# Patient Record
Sex: Male | Born: 1975 | Race: Black or African American | Hispanic: No | Marital: Single | Smoking: Current every day smoker
Health system: Southern US, Community
[De-identification: ages and names within clinical notes are randomized; demographics above are authoritative.]

## PROBLEM LIST (undated history)

## (undated) DIAGNOSIS — I10 Essential (primary) hypertension: Secondary | ICD-10-CM

## (undated) DIAGNOSIS — E119 Type 2 diabetes mellitus without complications: Secondary | ICD-10-CM

---

## 2015-02-26 ENCOUNTER — Emergency Department
Admit: 2015-02-26 | Disposition: A | Discharge status: Home / Self Care | Payer: Self-pay | Admitting: Emergency Medicine

## 2016-03-20 ENCOUNTER — Encounter: Payer: Self-pay | Admitting: Emergency Medicine

## 2016-03-20 ENCOUNTER — Emergency Department
Admission: EM | Admit: 2016-03-20 | Discharge: 2016-03-20 | Disposition: A | Discharge status: Home / Self Care | Payer: Self-pay | Attending: Emergency Medicine | Admitting: Emergency Medicine

## 2016-03-20 DIAGNOSIS — L240 Irritant contact dermatitis due to detergents: Secondary | ICD-10-CM | POA: Insufficient documentation

## 2016-03-20 DIAGNOSIS — E119 Type 2 diabetes mellitus without complications: Secondary | ICD-10-CM | POA: Insufficient documentation

## 2016-03-20 DIAGNOSIS — L258 Unspecified contact dermatitis due to other agents: Secondary | ICD-10-CM

## 2016-03-20 DIAGNOSIS — F17210 Nicotine dependence, cigarettes, uncomplicated: Secondary | ICD-10-CM | POA: Insufficient documentation

## 2016-03-20 HISTORY — DX: Type 2 diabetes mellitus without complications: E11.9

## 2016-03-20 MED ORDER — TRIAMCINOLONE ACETONIDE 0.1 % EX CREA: 1.0000 "application " | TOPICAL_CREAM | Freq: Four times a day (QID) | CUTANEOUS | Status: DC

## 2016-03-20 NOTE — Discharge Instructions (Signed)
Contact Dermatitis Dermatitis is redness, soreness, and swelling (inflammation) of the skin. Contact dermatitis is a reaction to certain substances that touch the skin. There are two types of contact dermatitis:   Irritant contact dermatitis. This type is caused by something that irritates your skin, such as dry hands from washing them too much. This type does not require previous exposure to the substance for a reaction to occur. This type is more common.  Allergic contact dermatitis. This type is caused by a substance that you are allergic to, such as a nickel allergy or poison ivy. This type only occurs if you have been exposed to the substance (allergen) before. Upon a repeat exposure, your body reacts to the substance. This type is less common. CAUSES  Many different substances can cause contact dermatitis. Irritant contact dermatitis is most commonly caused by exposure to:   Makeup.   Soaps.   Detergents.   Bleaches.   Acids.   Metal salts, such as nickel.  Allergic contact dermatitis is most commonly caused by exposure to:   Poisonous plants.   Chemicals.   Jewelry.   Latex.   Medicines.   Preservatives in products, such as clothing.  RISK FACTORS This condition is more likely to develop in:   People who have jobs that expose them to irritants or allergens.  People who have certain medical conditions, such as asthma or eczema.  SYMPTOMS  Symptoms of this condition may occur anywhere on your body where the irritant has touched you or is touched by you. Symptoms include:  Dryness or flaking.   Redness.   Cracks.   Itching.   Pain or a burning feeling.   Blisters.  Drainage of small amounts of blood or clear fluid from skin cracks. With allergic contact dermatitis, there may also be swelling in areas such as the eyelids, mouth, or genitals.  DIAGNOSIS  This condition is diagnosed with a medical history and physical exam. A patch skin test  may be performed to help determine the cause. If the condition is related to your job, you may need to see an occupational medicine specialist. TREATMENT Treatment for this condition includes figuring out what caused the reaction and protecting your skin from further contact. Treatment may also include:   Steroid creams or ointments. Oral steroid medicines may be needed in more severe cases.  Antibiotics or antibacterial ointments, if a skin infection is present.  Antihistamine lotion or an antihistamine taken by mouth to ease itching.  A bandage (dressing). HOME CARE INSTRUCTIONS Skin Care  Moisturize your skin as needed.   Apply cool compresses to the affected areas.  Try taking a bath with:  Epsom salts. Follow the instructions on the packaging. You can get these at your local pharmacy or grocery store.  Baking soda. Pour a small amount into the bath as directed by your health care provider.  Colloidal oatmeal. Follow the instructions on the packaging. You can get this at your local pharmacy or grocery store.  Try applying baking soda paste to your skin. Stir water into baking soda until it reaches a paste-like consistency.  Do not scratch your skin.  Bathe less frequently, such as every other day.  Bathe in lukewarm water. Avoid using hot water. Medicines  Take or apply over-the-counter and prescription medicines only as told by your health care provider.   If you were prescribed an antibiotic medicine, take or apply your antibiotic as told by your health care provider. Do not stop using the   antibiotic even if your condition starts to improve. General Instructions  Keep all follow-up visits as told by your health care provider. This is important.  Avoid the substance that caused your reaction. If you do not know what caused it, keep a journal to try to track what caused it. Write down:  What you eat.  What cosmetic products you use.  What you drink.  What  you wear in the affected area. This includes jewelry.  If you were given a dressing, take care of it as told by your health care provider. This includes when to change and remove it. SEEK MEDICAL CARE IF:   Your condition does not improve with treatment.  Your condition gets worse.  You have signs of infection such as swelling, tenderness, redness, soreness, or warmth in the affected area.  You have a fever.  You have new symptoms. SEEK IMMEDIATE MEDICAL CARE IF:   You have a severe headache, neck pain, or neck stiffness.  You vomit.  You feel very sleepy.  You notice red streaks coming from the affected area.  Your bone or joint underneath the affected area becomes painful after the skin has healed.  The affected area turns darker.  You have difficulty breathing.   This information is not intended to replace advice given to you by your health care provider. Make sure you discuss any questions you have with your health care provider.   Document Released: 11/01/2000 Document Revised: 07/26/2015 Document Reviewed: 03/22/2015 Elsevier Interactive Patient Education 2016 [employer]  

## 2016-03-20 NOTE — ED Notes (Signed)
Raised red rash to arms. Onset yesterday.  States recently changed soap to Dial.

## 2016-03-20 NOTE — ED Provider Notes (Signed)
[facility] Emergency Department Provider Note  ____________________________________________  Time seen: Approximately 10:27 PM  I have reviewed the triage vital signs and the nursing notes.   HISTORY  Chief Complaint Rash    HPI Nicholas Banks is a 40 y.o. male who presents emergency department for complaint of rash to bilateral forearms, genital region, and right side of face. Patient states that symptoms have been ongoing 2 days. It is pruritic in nature. There is no surrounding erythema or edema. No drainage noted. Patient denies any fevers or chills. He denies any ocular or oral involvement. Patient does endorse starting a new type of soap and symptoms have presented status post this change. Patient denies any other change in foods, medications, soaps or shampoos.   Past Medical History  Diagnosis Date  . Diabetes mellitus without complication (HCC)     There are no active problems to display for this patient.   History reviewed. No pertinent past surgical history.  Current Outpatient Rx  Name  Route  Sig  Dispense  Refill  . triamcinolone cream (KENALOG) 0.1 %   Topical   Apply 1 application topically 4 (four) times daily.   30 g   0     Allergies Review of patient's allergies indicates no known allergies.  No family history on file.  Social History Social History  Substance Use Topics  . Smoking status: Current Every Day Smoker -- 0.50 packs/day    Types: Cigarettes  . Smokeless tobacco: Never Used  . Alcohol Use: Yes     Comment: occasional     Review of Systems  Constitutional: No fever/chills Cardiovascular: no chest pain. Respiratory: no cough. No SOB. Musculoskeletal: Negative for musculoskeletal pain. Skin: Positive for rash to bilateral forearms, genital region, right side of face. Neurological: Negative for headaches, focal weakness or numbness. 10-point ROS otherwise  negative.  ____________________________________________   PHYSICAL EXAM:  VITAL SIGNS: ED Triage Vitals  Enc Vitals Group     BP 03/20/16 2048 153/107 mmHg     Pulse Rate 03/20/16 2048 88     Resp 03/20/16 2048 16     Temp 03/20/16 2048 98 F (36.7 C)     Temp Source 03/20/16 2048 Oral     SpO2 03/20/16 2048 99 %     Weight 03/20/16 2048 265 lb (120.203 kg)     Height 03/20/16 2048 6\' 4"  (1.93 m)     Head Cir --      Peak Flow --      Pain Score 03/20/16 2046 0     Pain Loc --      Pain Edu? --      Excl. in GC? --      Constitutional: Alert and oriented. Well appearing and in no acute distress. Eyes: Conjunctivae are normal. PERRL. EOMI. Head: Atraumatic. Cardiovascular: Normal rate, regular rhythm. Normal S1 and S2.  Good peripheral circulation. Respiratory: Normal respiratory effort without tachypnea or retractions. Lungs CTAB. Good air entry to the bases with no decreased or absent breath sounds. Musculoskeletal: Full range of motion to all extremities. No gross deformities appreciated. Neurologic:  Normal speech and language. No gross focal neurologic deficits are appreciated.  Skin:  Skin is warm, dry and intact.  Diffuse maculopapular rash noted to bilateral forearms and right side of face. Patient endorses same lesions to genital region but this is not visualized on exam. Area is nontender to palpation. No surrounding erythema or edema. No drainage noted. Area is not  warm to touch. Psychiatric: Mood and affect are normal. Speech and behavior are normal. Patient exhibits appropriate insight and judgement.   ____________________________________________   LABS (all labs ordered are listed, but only abnormal results are displayed)  Labs Reviewed - No data to display ____________________________________________  EKG   ____________________________________________  RADIOLOGY   No results  found.  ____________________________________________    PROCEDURES  Procedure(s) performed:       Medications - No data to display   ____________________________________________   INITIAL IMPRESSION / ASSESSMENT AND PLAN / ED COURSE  Pertinent labs & imaging results that were available during my care of the patient were reviewed by me and considered in my medical decision making (see chart for details).  Patient's diagnosis is consistent with contact dermatitis from new soap. Patient's exam is reassuring.. Patient will be discharged home with prescriptions for topical steroid ointment for symptom relief. Patient is advised to stop using new soap and referred back to original. Patient is to follow up with primary care provider as needed or otherwise directed. Patient is given ED precautions to return to the ED for any worsening or new symptoms.     ____________________________________________  FINAL CLINICAL IMPRESSION(S) / ED DIAGNOSES  Final diagnoses:  Contact dermatitis due to soap      NEW MEDICATIONS STARTED DURING THIS VISIT:  New Prescriptions   TRIAMCINOLONE CREAM (KENALOG) 0.1 %    Apply 1 application topically 4 (four) times daily.        This chart was dictated using voice recognition software/Dragon. Despite best efforts to proofread, errors can occur which can change the meaning. Any change was purely unintentional.    Racheal Patches, PA-C 03/20/16 2233  Sharman Cheek, MD 03/21/16 [phone]

## 2016-09-05 ENCOUNTER — Emergency Department
Admission: EM | Admit: 2016-09-05 | Discharge: 2016-09-05 | Disposition: A | Discharge status: Home / Self Care | Payer: Self-pay | Attending: Emergency Medicine | Admitting: Emergency Medicine

## 2016-09-05 ENCOUNTER — Encounter: Payer: Self-pay | Admitting: Emergency Medicine

## 2016-09-05 ENCOUNTER — Emergency Department: Payer: Self-pay

## 2016-09-05 DIAGNOSIS — E1165 Type 2 diabetes mellitus with hyperglycemia: Secondary | ICD-10-CM | POA: Insufficient documentation

## 2016-09-05 DIAGNOSIS — F17210 Nicotine dependence, cigarettes, uncomplicated: Secondary | ICD-10-CM | POA: Insufficient documentation

## 2016-09-05 DIAGNOSIS — Z79899 Other long term (current) drug therapy: Secondary | ICD-10-CM | POA: Insufficient documentation

## 2016-09-05 DIAGNOSIS — K5641 Fecal impaction: Secondary | ICD-10-CM

## 2016-09-05 DIAGNOSIS — R739 Hyperglycemia, unspecified: Secondary | ICD-10-CM

## 2016-09-05 DIAGNOSIS — I10 Essential (primary) hypertension: Secondary | ICD-10-CM | POA: Insufficient documentation

## 2016-09-05 LAB — GLUCOSE, CAPILLARY: Glucose-Capillary: 232 mg/dL — ABNORMAL HIGH (ref 65–99)

## 2016-09-05 MED ORDER — DOCUSATE SODIUM 100 MG PO CAPS: 100.0000 mg | ORAL_CAPSULE | Freq: Every day | ORAL | 0 refills | Status: AC | PRN

## 2016-09-05 MED ORDER — LISINOPRIL 5 MG PO TABS: 5.0000 mg | ORAL_TABLET | Freq: Every day | ORAL | 0 refills | Status: DC

## 2016-09-05 MED ORDER — METFORMIN HCL 500 MG PO TABS: 500.0000 mg | ORAL_TABLET | Freq: Every day | ORAL | 0 refills | Status: AC

## 2016-09-05 NOTE — ED Provider Notes (Signed)
[facility] Emergency Department Provider Note   ____________________________________________   First MD Initiated Contact with Patient 09/05/16 0701     (approximate)  I have reviewed the triage vital signs and the nursing notes.   HISTORY  Chief Complaint Fecal Impaction   HPI Nicholas Banks is a 40 y.o. male with a history of diabetes and hypertension is present emergency department today with a fecal impaction. He says that he moves his bowels every morning between 4 and 5 AM and normally has a well-formed, easily passed a bowel movement.  However, today he said that he had "little balls" that he was passing and had to push hard in order to pass his stools. He said that after passing several of the hard stools he felt that one of the stools became stuck and then he came to the emergency department. He says it was highly uncomfortable, but once arriving to the emergency department he was able to pass the stool. He denies any abdominal pain. Denies any nausea or vomiting. Says that he is also diabetic and has a history of hypertension but has been off of his medications. He says that he was on metformin as well as lisinopril 5 mg.   Past Medical History:  Diagnosis Date  . Diabetes mellitus without complication (HCC)     There are no active problems to display for this patient.   History reviewed. No pertinent surgical history.  Prior to Admission medications   Medication Sig Start Date End Date Taking? Authorizing Provider  triamcinolone cream (KENALOG) 0.1 % Apply 1 application topically 4 (four) times daily. 03/20/16   Delorise Royals Cuthriell, PA-C    Allergies Review of patient's allergies indicates no known allergies.  No family history on file.  Social History Social History  Substance Use Topics  . Smoking status: Current Every Day Smoker    Packs/day: 0.50    Types: Cigarettes  . Smokeless tobacco: Never Used  . Alcohol use Yes   Comment: occasional    Review of Systems Constitutional: No fever/chills Eyes: No visual changes. ENT: No sore throat. Cardiovascular: Denies chest pain. Respiratory: Denies shortness of breath. Gastrointestinal: No abdominal pain.  No nausea, no vomiting.  No diarrhea.   Genitourinary: Negative for dysuria. Musculoskeletal: Negative for back pain. Skin: Negative for rash. Neurological: Negative for headaches, focal weakness or numbness.  10-point ROS otherwise negative.  ____________________________________________   PHYSICAL EXAM:  VITAL SIGNS: ED Triage Vitals  Enc Vitals Group     BP 09/05/16 0609 (!) 181/113     Pulse Rate 09/05/16 0609 100     Resp 09/05/16 0609 18     Temp 09/05/16 0609 97.9 F (36.6 C)     Temp Source 09/05/16 0609 Oral     SpO2 09/05/16 0609 99 %     Weight 09/05/16 0608 265 lb (120.2 kg)     Height 09/05/16 0608 6\' 4"  (1.93 m)     Head Circumference --      Peak Flow --      Pain Score 09/05/16 0609 7     Pain Loc --      Pain Edu? --      Excl. in GC? --     Constitutional: Alert and oriented. Well appearing and in no acute distress. Eyes: Conjunctivae are normal. PERRL. EOMI. Head: Atraumatic. Nose: No congestion/rhinnorhea. Mouth/Throat: Mucous membranes are moist.   Neck: No stridor.   Cardiovascular: Normal rate, regular rhythm. Grossly normal heart  sounds.   Respiratory: Normal respiratory effort.  No retractions. Lungs CTAB. Gastrointestinal: Soft and nontender. No distention. No CVA tenderness. Musculoskeletal: No lower extremity tenderness nor edema.  No joint effusions. Neurologic:  Normal speech and language. No gross focal neurologic deficits are appreciated. Skin:  Skin is warm, dry and intact. No rash noted. Psychiatric: Mood and affect are normal. Speech and behavior are normal.  ____________________________________________   LABS (all labs ordered are listed, but only abnormal results are displayed)  Labs Reviewed   GLUCOSE, CAPILLARY - Abnormal; Notable for the following:       Result Value   Glucose-Capillary 232 (*)    All other components within normal limits  CBG MONITORING, ED   ____________________________________________  EKG   ____________________________________________  RADIOLOGY  DG Abdomen 1 View (Accession [id number]) (Order [id number])  Imaging  Date: 09/05/2016 Department: [facility] EMERGENCY DEPARTMENT Released By/Authorizing: Irean HongJade J Sung, MD (auto-released)  Exam Information   Status Exam Begun  Exam Ended   Final [99] 09/05/2016 7:00 AM 09/05/2016 7:01 AM  PACS Images   Show images for DG Abdomen 1 View  Study Result   CLINICAL DATA:  Constipation.  EXAM: ABDOMEN - 1 VIEW  COMPARISON:  No recent prior .  FINDINGS: Soft tissue structures are unremarkable. Moderate stool volume. No bowel distention. Pelvic calcification consistent phlebolith. No acute bony abnormality.  IMPRESSION: Moderate stool volume.  No bowel distention.  No acute abnormality.   Electronically Signed   By: Maisie Fushomas  Register   On: 09/05/2016 07:10     ____________________________________________   PROCEDURES  Procedure(s) performed:   Procedures  Critical Care performed:   ____________________________________________   INITIAL IMPRESSION / ASSESSMENT AND PLAN / ED COURSE  Pertinent labs & imaging results that were available during my care of the patient were reviewed by me and considered in my medical decision making (see chart for details).  ----------------------------------------- 7:46 AM on 09/05/2016 -----------------------------------------  Patient resting comfortably at this time. Patient is hypertensive and his point-of-care blood glucose was 232. I'll be restarting him on his metformin. I will restart 500 mg once a day. I discussed the risks of lisinopril such as angioedema but the patient says that he tolerated it well and does  not want to change medications and would like to continue on lisinopril instead of switching to amlodipine. I will restart him on 5 mg of lisinopril. We discussed dietary changes such as adding fiber and fruits and vegetables to his diet. The patient says that he drinks plenty of water. [facility] his chronic diseases. We discussed consultations of these diseases such as stroke, heart attack as well as kidney failure. The patient knows that he must have ongoing monitoring and will be following up with primary care   Clinical Course     ____________________________________________   FINAL CLINICAL IMPRESSION(S) / ED DIAGNOSES  Fecal impaction, resolved. Uncontrolled hypertension. Hyperglycemia.    NEW MEDICATIONS STARTED DURING THIS VISIT:  New Prescriptions   No medications on file     Note:  This document was prepared using Dragon voice recognition software and may include unintentional dictation errors.    Myrna Blazeravid Matthew Schaevitz, MD 09/05/16 [phone]

## 2016-09-05 NOTE — ED Triage Notes (Addendum)
Patient ambulatory to triage with steady gait, without difficulty or distress noted; pt reports rectal pain from impacted stool; denies N/V or abd pain

## 2017-05-12 ENCOUNTER — Encounter: Payer: Self-pay | Admitting: Emergency Medicine

## 2017-05-12 ENCOUNTER — Emergency Department
Admission: EM | Admit: 2017-05-12 | Discharge: 2017-05-12 | Disposition: A | Discharge status: Home / Self Care | Payer: Self-pay | Attending: Emergency Medicine | Admitting: Emergency Medicine

## 2017-05-12 DIAGNOSIS — S30861A Insect bite (nonvenomous) of abdominal wall, initial encounter: Secondary | ICD-10-CM | POA: Insufficient documentation

## 2017-05-12 DIAGNOSIS — Y939 Activity, unspecified: Secondary | ICD-10-CM | POA: Insufficient documentation

## 2017-05-12 DIAGNOSIS — Y999 Unspecified external cause status: Secondary | ICD-10-CM | POA: Insufficient documentation

## 2017-05-12 DIAGNOSIS — Z7984 Long term (current) use of oral hypoglycemic drugs: Secondary | ICD-10-CM | POA: Insufficient documentation

## 2017-05-12 DIAGNOSIS — Z79899 Other long term (current) drug therapy: Secondary | ICD-10-CM | POA: Insufficient documentation

## 2017-05-12 DIAGNOSIS — W57XXXA Bitten or stung by nonvenomous insect and other nonvenomous arthropods, initial encounter: Secondary | ICD-10-CM | POA: Insufficient documentation

## 2017-05-12 DIAGNOSIS — Y929 Unspecified place or not applicable: Secondary | ICD-10-CM | POA: Insufficient documentation

## 2017-05-12 DIAGNOSIS — F17210 Nicotine dependence, cigarettes, uncomplicated: Secondary | ICD-10-CM | POA: Insufficient documentation

## 2017-05-12 DIAGNOSIS — E119 Type 2 diabetes mellitus without complications: Secondary | ICD-10-CM | POA: Insufficient documentation

## 2017-05-12 MED ORDER — DOXYCYCLINE HYCLATE 100 MG PO CAPS: 100.0000 mg | ORAL_CAPSULE | Freq: Two times a day (BID) | ORAL | 0 refills | Status: AC

## 2017-05-12 NOTE — ED Triage Notes (Signed)
Pt ambulatory to triage with steady gait, no distress noted. Pt reports removing tick from belly button on Sunday, since this AM pt has had drainage and bleeding in area of removal. Pts BP is elevated in triage, pt none compliant with medications.

## 2017-05-12 NOTE — ED Provider Notes (Signed)
[facility] Emergency Department Provider Note  ____________________________________________  Time seen: Approximately 9:23 PM  I have reviewed the triage vital signs and the nursing notes.   HISTORY  Chief Complaint Insect Bite    HPI Nicholas Banks is a 41 y.o. male presenting to the emergency department after being bitten by a tick 2 days ago. Patient has noticed blood being expressed from bite site along with some purulent exudate and erythema surrounding tick bite. Patient denies fever, arthralgias, headache or myalgias. No alleviating measures have been attempted.   Past Medical History:  Diagnosis Date  . Diabetes mellitus without complication (HCC)     There are no active problems to display for this patient.   History reviewed. No pertinent surgical history.  Prior to Admission medications   Medication Sig Start Date End Date Taking? Authorizing Provider  docusate sodium (COLACE) 100 MG capsule Take 1 capsule (100 mg total) by mouth daily as needed. 09/05/16 09/05/17  Banks, Myra Rudeavid Matthew, MD  doxycycline (VIBRAMYCIN) 100 MG capsule Take 1 capsule (100 mg total) by mouth 2 (two) times daily. 05/12/17 05/19/17  Orvil FeilWoods, Nicholas Amorin M, PA-C  lisinopril (PRINIVIL,ZESTRIL) 5 MG tablet Take 1 tablet (5 mg total) by mouth daily. 09/05/16 09/05/17  Myrna BlazerSchaevitz, Nicholas Matthew, MD  metFORMIN (GLUCOPHAGE) 500 MG tablet Take 1 tablet (500 mg total) by mouth daily with breakfast. 09/05/16 09/05/17  Myrna BlazerSchaevitz, Nicholas Matthew, MD  triamcinolone cream (KENALOG) 0.1 % Apply 1 application topically 4 (four) times daily. 03/20/16   Banks, Delorise RoyalsJonathan D, PA-C    Allergies Patient has no known allergies.  History reviewed. No pertinent family history.  Social History Social History  Substance Use Topics  . Smoking status: Current Every Day Smoker    Packs/day: 0.50    Types: Cigarettes  . Smokeless tobacco: Never Used  . Alcohol use Yes     Comment: occasional      Review of Systems  Constitutional: No fever/chills Eyes: No visual changes. No discharge ENT: No upper respiratory complaints. Cardiovascular: no chest pain. Respiratory: no cough. No SOB. Gastrointestinal: No abdominal pain.  No nausea, no vomiting.  No diarrhea. No constipation. Musculoskeletal: Negative for musculoskeletal pain. Skin: Patient has tick bite Neurological: Negative for headaches, focal weakness or numbness. ____________________________________________   PHYSICAL EXAM:  VITAL SIGNS: ED Triage Vitals  Enc Vitals Group     BP 05/12/17 2037 (!) 174/110     Pulse Rate 05/12/17 2037 88     Resp 05/12/17 2037 16     Temp 05/12/17 2037 98 F (36.7 C)     Temp Source 05/12/17 2037 Oral     SpO2 05/12/17 2037 99 %     Weight 05/12/17 2033 265 lb (120.2 kg)     Height --      Head Circumference --      Peak Flow --      Pain Score 05/12/17 2041 1     Pain Loc --      Pain Edu? --      Excl. in GC? --      Constitutional: Alert and oriented. Well appearing and in no acute distress. Eyes: Conjunctivae are normal. PERRL. EOMI. Head: Atraumatic. Cardiovascular: Normal rate, regular rhythm. Normal S1 and S2.  Good peripheral circulation. Respiratory: Normal respiratory effort without tachypnea or retractions. Lungs CTAB. Good air entry to the bases with no decreased or absent breath sounds. Gastrointestinal: Bowel sounds 4 quadrants. Soft and nontender to palpation. No guarding or rigidity. No  palpable masses. No distention. No CVA tenderness. Musculoskeletal: Full range of motion to all extremities. No gross deformities appreciated. Neurologic:  Normal speech and language. No gross focal neurologic deficits are appreciated.  Skin: Patient has tick bite along inner belly button with evidence of blood and purulent exudate. Mild surrounding erythema visualized. Psychiatric: Mood and affect are normal. Speech and behavior are normal. Patient exhibits appropriate  insight and judgement.   ____________________________________________   LABS (all labs ordered are listed, but only abnormal results are displayed)  Labs Reviewed - No data to display ____________________________________________  EKG   ____________________________________________  RADIOLOGY   No results found.  ____________________________________________    PROCEDURES  Procedure(s) performed:    Procedures    Medications - No data to display   ____________________________________________   INITIAL IMPRESSION / ASSESSMENT AND PLAN / ED COURSE  Pertinent labs & imaging results that were available during my care of the patient were reviewed by me and considered in my medical decision making (see chart for details).  Review of the [place] CSRS was performed in accordance of the [facility] prior to dispensing any controlled drugs.     Assessment and plan: Tick bite: Patient presents to the emergency department with tick bite. Evidence of blood, purulent exudate and surrounding erythema visualized on physical exam. Patient was discharged with doxycycline. He was advised to follow-up with primary care as needed. Vital signs were reassuring prior to discharge. All patient questions were answered.   ____________________________________________  FINAL CLINICAL IMPRESSION(S) / ED DIAGNOSES  Final diagnoses:  Tick bite, initial encounter      NEW MEDICATIONS STARTED DURING THIS VISIT:  Discharge Medication List as of 05/12/2017  9:01 PM    START taking these medications   Details  doxycycline (VIBRAMYCIN) 100 MG capsule Take 1 capsule (100 mg total) by mouth 2 (two) times daily., Starting Mon 05/12/2017, Until Mon 05/19/2017, Print            This chart was dictated using voice recognition software/Dragon. Despite best efforts to proofread, errors can occur which can change the meaning. Any change was purely unintentional.    Nicholas Banks 05/12/17  2129    Nicholas Menghini, MD 05/12/17 [phone]

## 2017-07-29 ENCOUNTER — Emergency Department
Admission: EM | Admit: 2017-07-29 | Discharge: 2017-07-29 | Disposition: A | Discharge status: Home / Self Care | Payer: 59 | Attending: Emergency Medicine | Admitting: Emergency Medicine

## 2017-07-29 ENCOUNTER — Encounter: Payer: Self-pay | Admitting: Emergency Medicine

## 2017-07-29 ENCOUNTER — Emergency Department: Payer: 59

## 2017-07-29 DIAGNOSIS — Z79899 Other long term (current) drug therapy: Secondary | ICD-10-CM | POA: Diagnosis not present

## 2017-07-29 DIAGNOSIS — F17210 Nicotine dependence, cigarettes, uncomplicated: Secondary | ICD-10-CM | POA: Diagnosis not present

## 2017-07-29 DIAGNOSIS — L02512 Cutaneous abscess of left hand: Secondary | ICD-10-CM | POA: Diagnosis not present

## 2017-07-29 DIAGNOSIS — E119 Type 2 diabetes mellitus without complications: Secondary | ICD-10-CM | POA: Diagnosis not present

## 2017-07-29 DIAGNOSIS — Z7984 Long term (current) use of oral hypoglycemic drugs: Secondary | ICD-10-CM | POA: Insufficient documentation

## 2017-07-29 DIAGNOSIS — R2232 Localized swelling, mass and lump, left upper limb: Secondary | ICD-10-CM | POA: Diagnosis present

## 2017-07-29 MED ORDER — SULFAMETHOXAZOLE-TRIMETHOPRIM 800-160 MG PO TABS: 1.0000 | ORAL_TABLET | Freq: Two times a day (BID) | ORAL | 0 refills | Status: DC

## 2017-07-29 MED ORDER — SULFAMETHOXAZOLE-TRIMETHOPRIM 800-160 MG PO TABS
1.0000 | ORAL_TABLET | Freq: Once | ORAL | Status: AC
  Administered 2017-07-29: 1 via ORAL
  Filled 2017-07-29: qty 1

## 2017-07-29 NOTE — ED Triage Notes (Signed)
Pt c/o left finger pain. Pt reports he picked up a wooden pallet 1 week ago and now has a splinter in the right middle finger. Area is swollen and red.

## 2017-07-29 NOTE — Discharge Instructions (Signed)
Please soak left third digit and half peroxide half warm water 3 times daily for 5 minutes. Return to the emergency department for any swelling, pain, redness worsening symptoms or changes in her health.

## 2017-07-29 NOTE — ED Provider Notes (Signed)
[facility]-EMERGENCY DEPARTMENT Provider Note   CSN: [id number] Arrival date & time: 07/29/17  2147     History   Chief Complaint Chief Complaint  Patient presents with  . Finger Injury    HPI Nicholas DELAHOUSSAYE is a 41 y.o. male presents to the emergency department for evaluation of pain and swelling to his left third digit. Patient states 1 week ago he was lifting a heavy wooden pallet when he felt a splinter go into the volar aspect of his left third digit along the PIP joint. Over the last week he's developed some pain and swelling. He attempted to open the area today and expressed purulent drainage. His pain is mild. He has not been taking any antibiotics or pain medications. He has had mild swelling along the PIP joint but no other swelling throughout the digit. Patient states his tetanus is up-to-date.  HPI  Past Medical History:  Diagnosis Date  . Diabetes mellitus without complication (HCC)     There are no active problems to display for this patient.   History reviewed. No pertinent surgical history.     Home Medications    Prior to Admission medications   Medication Sig Start Date End Date Taking? Authorizing Provider  docusate sodium (COLACE) 100 MG capsule Take 1 capsule (100 mg total) by mouth daily as needed. 09/05/16 09/05/17  Myrna Blazer, MD  lisinopril (PRINIVIL,ZESTRIL) 5 MG tablet Take 1 tablet (5 mg total) by mouth daily. 09/05/16 09/05/17  Myrna Blazer, MD  metFORMIN (GLUCOPHAGE) 500 MG tablet Take 1 tablet (500 mg total) by mouth daily with breakfast. 09/05/16 09/05/17  Myrna Blazer, MD  sulfamethoxazole-trimethoprim (BACTRIM DS,SEPTRA DS) 800-160 MG tablet Take 1 tablet by mouth 2 (two) times daily. 07/29/17   Evon Slack, PA-C  triamcinolone cream (KENALOG) 0.1 % Apply 1 application topically 4 (four) times daily. 03/20/16   Cuthriell, Delorise Royals, PA-C    Family History History reviewed. No pertinent family  history.  Social History Social History  Substance Use Topics  . Smoking status: Current Every Day Smoker    Packs/day: 0.50    Types: Cigarettes  . Smokeless tobacco: Never Used  . Alcohol use Yes     Comment: occasional     Allergies   Patient has no known allergies.   Review of Systems Review of Systems  Constitutional: Negative for fever.  Musculoskeletal: Positive for arthralgias and joint swelling. Negative for back pain and myalgias.  Skin: Positive for wound. Negative for rash.  Neurological: Negative for numbness.     Physical Exam Updated Vital Signs BP 134/78 (BP Location: Right Arm)   Pulse 88   Temp 98 F (36.7 C) (Oral)   Resp 17   Wt 120.2 kg (265 lb)   SpO2 99%   BMI 32.26 kg/m   Physical Exam  Constitutional: He is oriented to person, place, and time. He appears well-developed and well-nourished.  HENT:  Head: Normocephalic and atraumatic.  Eyes: Conjunctivae are normal.  Neck: Normal range of motion.  Cardiovascular: Normal rate.   Pulmonary/Chest: Effort normal. No respiratory distress.  Musculoskeletal: Normal range of motion.  Examination of the left third digit shows callus along the volar aspect of the left third digit at the PIP joint with mild underlying fluctuance. Area of fluctuance is 0.5 cm in diameter. Patient is able to make a full fist. He has no pain with active or passive flexion or extension of the third digit. There is no warmth or  redness. Sensation is intact distally. Overall size of the left third digit is symmetrical to his right and there is no significant swelling.  Neurological: He is alert and oriented to person, place, and time.  Skin: Skin is warm. No rash noted.  Psychiatric: He has a normal mood and affect. His behavior is normal. Thought content normal.     ED Treatments / Results  Labs (all labs ordered are listed, but only abnormal results are displayed) Labs Reviewed - No data to display  EKG  EKG  Interpretation None       Radiology No results found. Imaging: AP lateral and oblique views of the left third digit were ordered and reviewed by me today. Impression: No evidence of acute bony abnormality or foreign body. No significant arthropathy or abnormal bony lesions. Mild soft tissue swelling noted along the volar aspect of the PIP joint   Procedures Procedures (including critical care time) INCISION AND DRAINAGE Performed by: Patience MuscaGAINES, THOMAS CHRISTOPHER Consent: Verbal consent obtained. Risks and benefits: risks, benefits and alternatives were discussed Type: abscess  Body area: Left third digit Anesthesia: local infiltration  Incision was made with a scalpel. 11 blade was used to de-roof the callus, patient tolerated the procedure well and a few cc of Pergola drainage was expressed. No palpable or visible foreign body seen underneath the callus/abscess. Underlying tissue appears well with no fluctuance, redness.  Local anesthetic: None    Complexity: complex Blunt dissection to break up loculations  Drainage: purulent  Drainage amount: 3 mL purulent drainage    Patient tolerance: Patient tolerated the procedure well with no immediate complications.     Medications Ordered in ED Medications  sulfamethoxazole-trimethoprim (BACTRIM DS,SEPTRA DS) 800-160 MG per tablet 1 tablet (1 tablet Oral Given 07/29/17 2249)     Initial Impression / Assessment and Plan / ED Course  I have reviewed the triage vital signs and the nursing notes.  Pertinent labs & imaging results that were available during my care of the patient were reviewed by me and considered in my medical decision making (see chart for details).     41 year old male with possible wooden foreign body to the left third PIP joint. Callus was D roofed and underlying abscess expressed. No visible foreign body. Patient will soak 3 times daily and half peroxide and warm water. He will be started with antibiotic.  Tetanus is up-to-date. Tylenol and ibuprofen as needed for pain. Educated on signs and symptoms to return to the ED for.  Final Clinical Impressions(s) / ED Diagnoses   Final diagnoses:  Abscess of left middle finger    New Prescriptions New Prescriptions   SULFAMETHOXAZOLE-TRIMETHOPRIM (BACTRIM DS,SEPTRA DS) 800-160 MG TABLET    Take 1 tablet by mouth 2 (two) times daily.     Evon SlackGaines, Thomas C, PA-C 07/29/17 2253    Rockne MenghiniNorman, Anne-Caroline, MD 07/29/17 [phone]

## 2017-12-22 IMAGING — CR DG ABDOMEN 1V
2 series · 2 of 2 positions shown · non-contrast
Comparison: No recent prior .

CLINICAL DATA: Constipation.

EXAM:
ABDOMEN - 1 VIEW

[abdomen kub (1 of 2)]
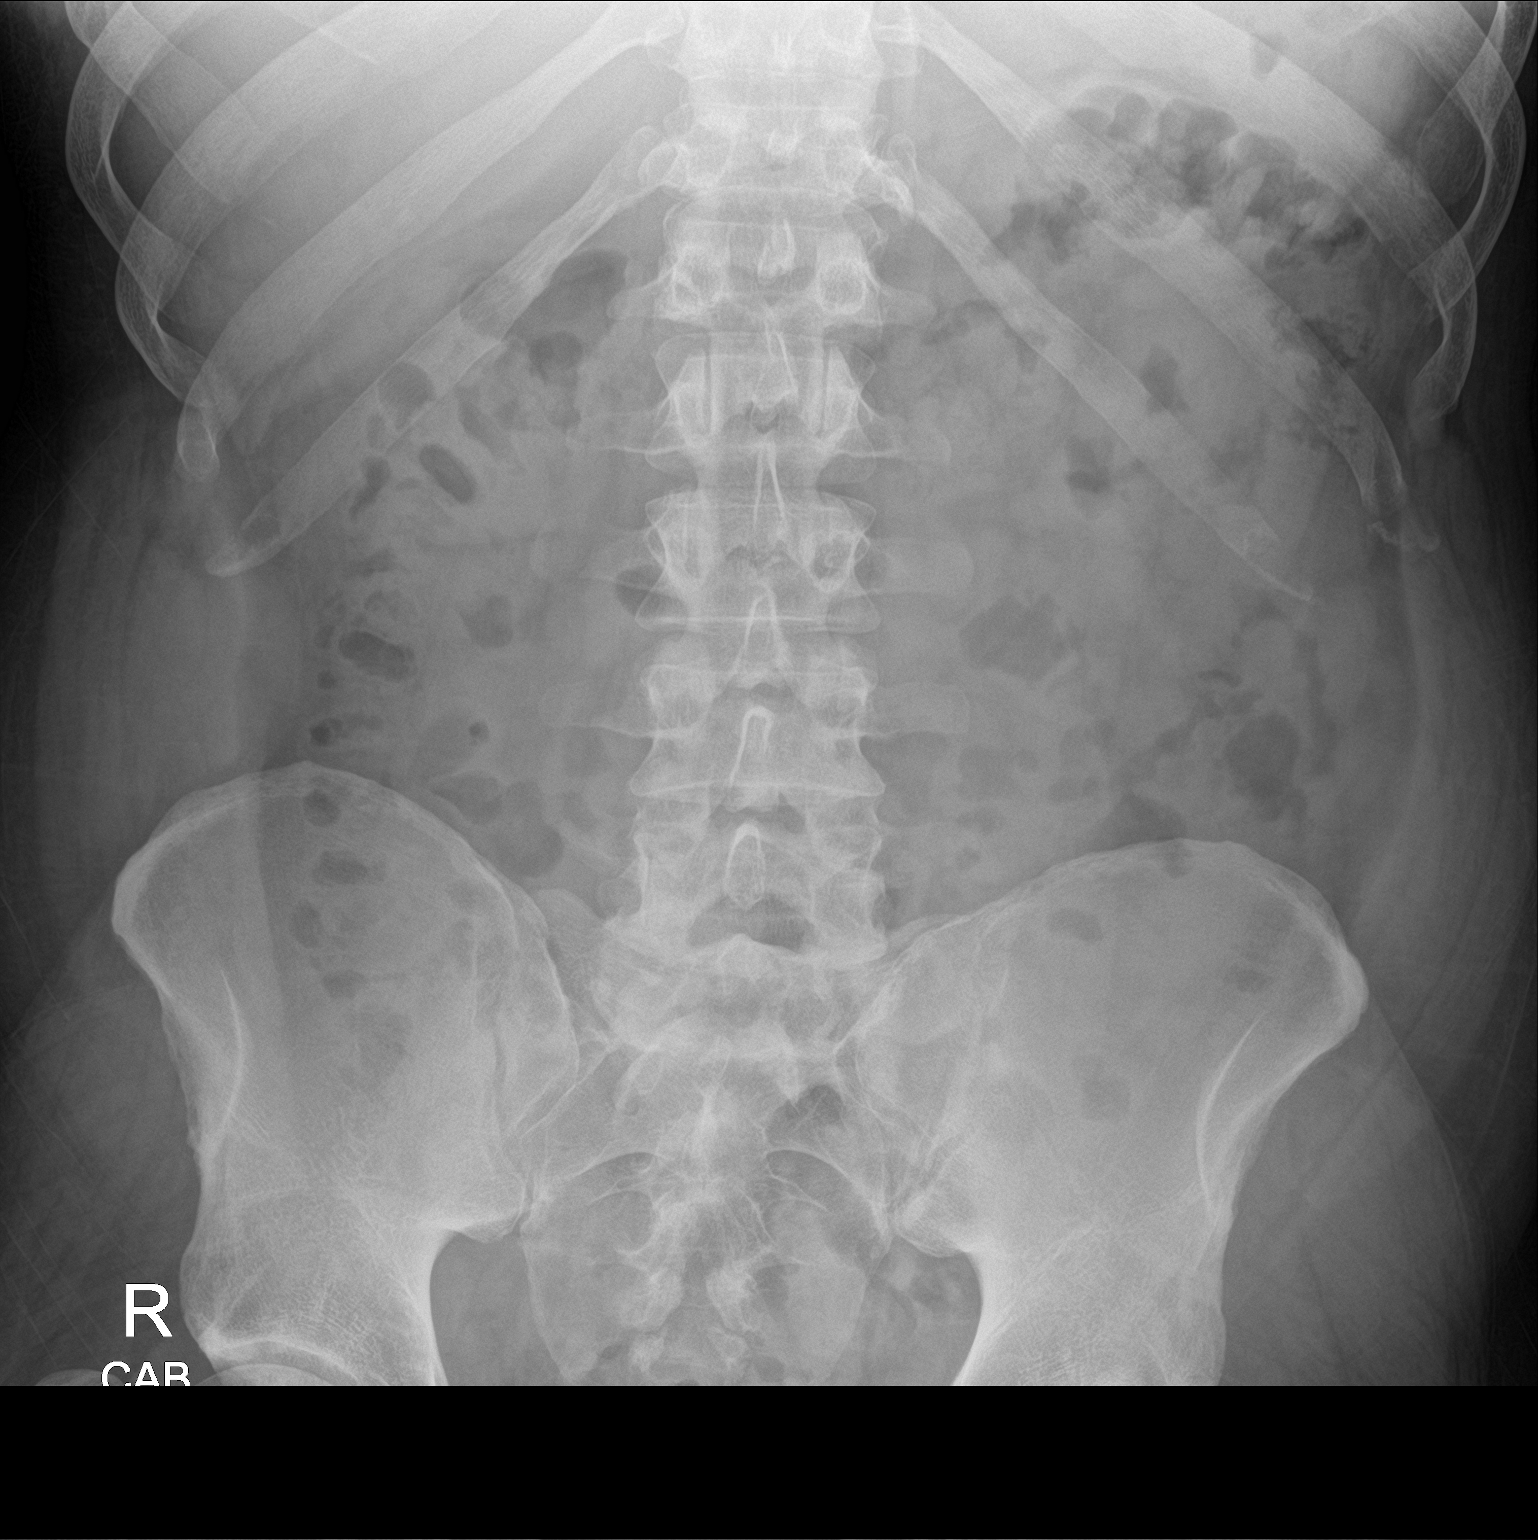

[abdomen kub (2 of 2)]
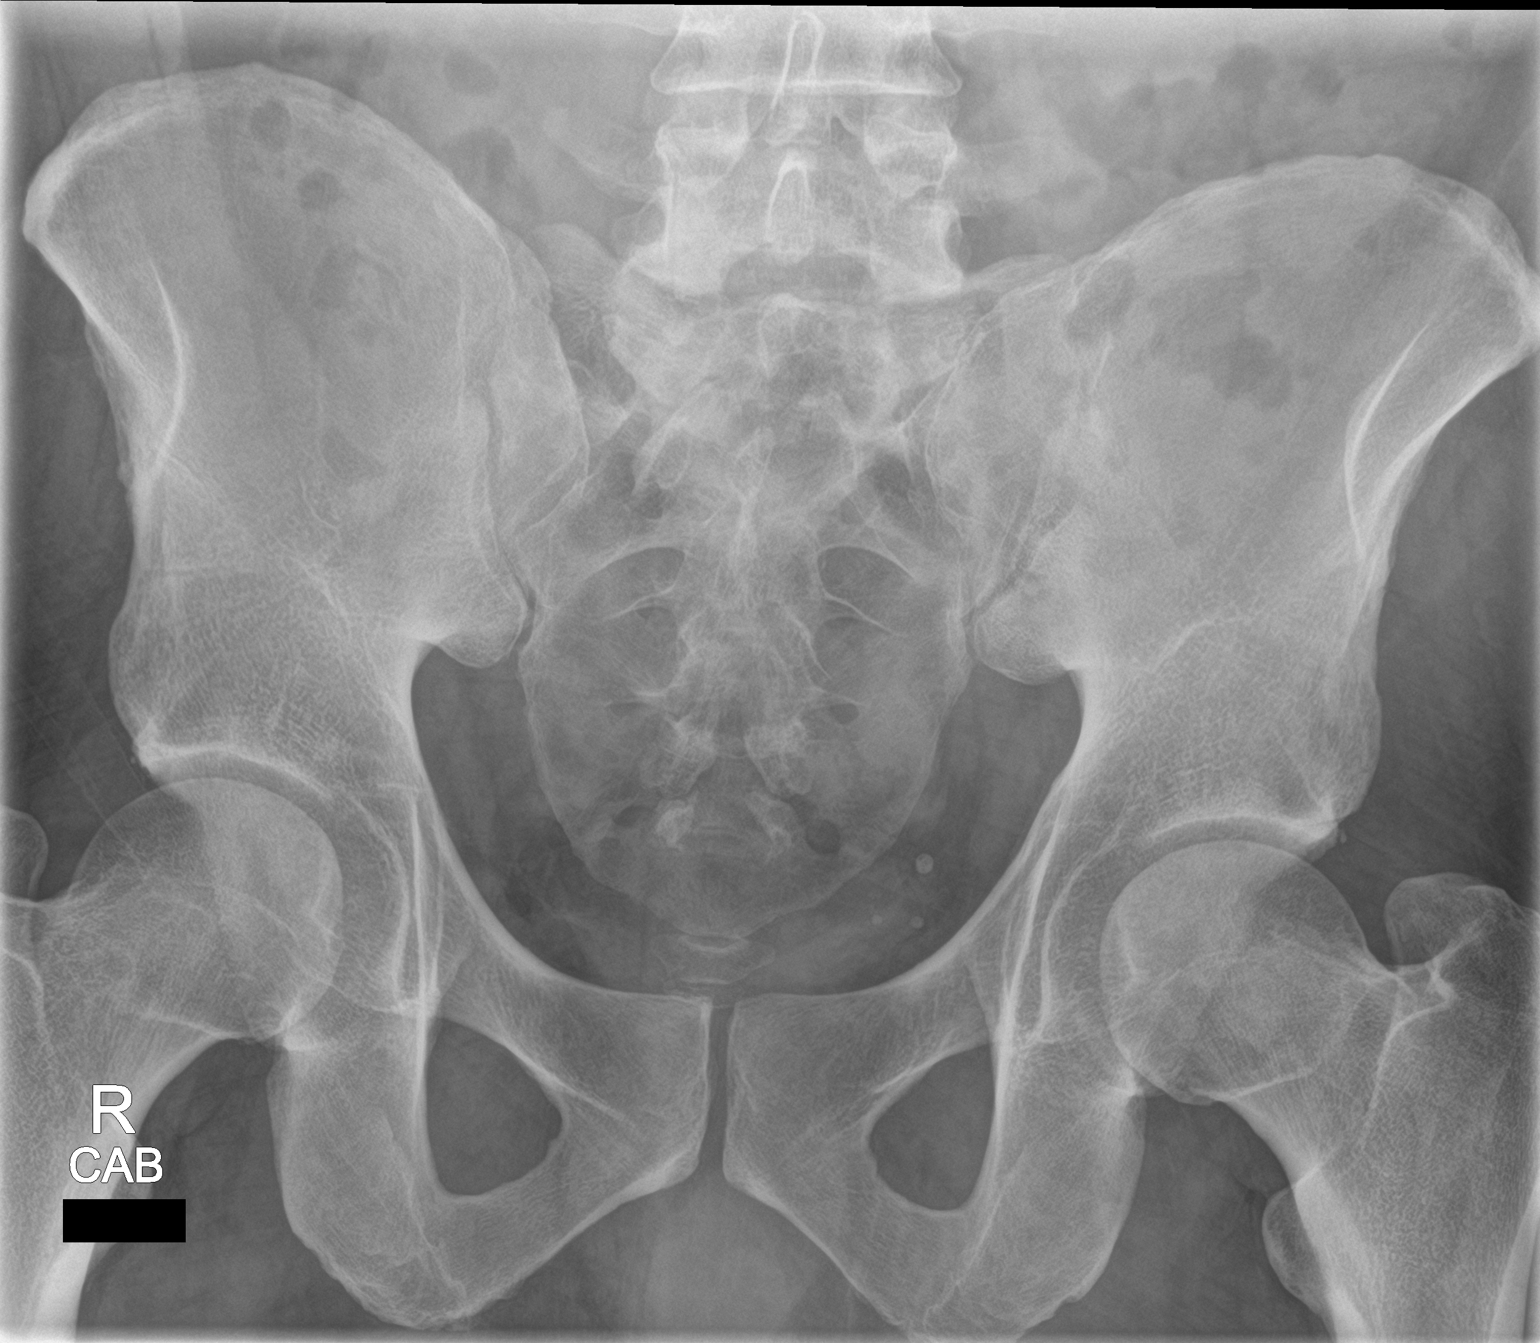

[2 of 2 positions shown; findings below may reference images not displayed]

FINDINGS: Soft tissue structures are unremarkable. Moderate stool volume. No
bowel distention. Pelvic calcification consistent phlebolith. No
acute bony abnormality.
IMPRESSION: Moderate stool volume.  No bowel distention.  No acute abnormality.

## 2018-02-14 ENCOUNTER — Encounter: Payer: Self-pay | Admitting: Emergency Medicine

## 2018-02-14 ENCOUNTER — Other Ambulatory Visit: Payer: Self-pay

## 2018-02-14 DIAGNOSIS — I10 Essential (primary) hypertension: Secondary | ICD-10-CM | POA: Diagnosis not present

## 2018-02-14 DIAGNOSIS — R05 Cough: Secondary | ICD-10-CM | POA: Diagnosis present

## 2018-02-14 DIAGNOSIS — Z79899 Other long term (current) drug therapy: Secondary | ICD-10-CM | POA: Insufficient documentation

## 2018-02-14 DIAGNOSIS — J069 Acute upper respiratory infection, unspecified: Secondary | ICD-10-CM | POA: Diagnosis not present

## 2018-02-14 DIAGNOSIS — Z7984 Long term (current) use of oral hypoglycemic drugs: Secondary | ICD-10-CM | POA: Diagnosis not present

## 2018-02-14 DIAGNOSIS — E1165 Type 2 diabetes mellitus with hyperglycemia: Secondary | ICD-10-CM | POA: Diagnosis not present

## 2018-02-14 DIAGNOSIS — F17210 Nicotine dependence, cigarettes, uncomplicated: Secondary | ICD-10-CM | POA: Diagnosis not present

## 2018-02-14 LAB — INFLUENZA PANEL BY PCR (TYPE A & B)
INFLBPCR: NEGATIVE
Influenza A By PCR: NEGATIVE

## 2018-02-14 MED ORDER — IBUPROFEN 800 MG PO TABS
800.0000 mg | ORAL_TABLET | Freq: Once | ORAL | Status: AC
  Administered 2018-02-14: 800 mg via ORAL
  Filled 2018-02-14: qty 1

## 2018-02-14 NOTE — ED Triage Notes (Signed)
Pt reports fever, cough and generalized body aches that started today; nonproductive cough; chills; took Dayquil around 1pm;

## 2018-02-15 ENCOUNTER — Emergency Department
Admission: EM | Admit: 2018-02-15 | Discharge: 2018-02-15 | Disposition: A | Discharge status: Home / Self Care | Payer: 59 | Attending: Emergency Medicine | Admitting: Emergency Medicine

## 2018-02-15 DIAGNOSIS — J069 Acute upper respiratory infection, unspecified: Secondary | ICD-10-CM

## 2018-02-15 DIAGNOSIS — R739 Hyperglycemia, unspecified: Secondary | ICD-10-CM

## 2018-02-15 HISTORY — DX: Essential (primary) hypertension: I10

## 2018-02-15 LAB — GLUCOSE, CAPILLARY: GLUCOSE-CAPILLARY: 271 mg/dL — AB (ref 65–99)

## 2018-02-15 NOTE — ED Notes (Signed)
ED Provider at bedside. 

## 2018-02-15 NOTE — ED Notes (Signed)
Girfriend at bedside. Pt asked by this EDT if he was still in pain pt states, " Pain? Nah I aint here for no pain. Im just here for a fever".

## 2018-02-15 NOTE — ED Notes (Signed)
MD notified of 271 glucose cbg result

## 2018-02-15 NOTE — ED Notes (Signed)
Patient to stat desk, explained process and that would be taken to treatment room as soon as possible.

## 2018-02-15 NOTE — ED Provider Notes (Signed)
[facility] Emergency Department Provider Note  ___________________________________________   First MD Initiated Contact with Patient 02/15/18 [phone]     (approximate)  I have reviewed the triage vital signs and the nursing notes.   HISTORY  Chief Complaint Cough; Fever; and Generalized Body Aches   HPI Nicholas Banks is a 42 y.o. male with a history of diabetes and hypertension was presenting with 24 hours of cough fever and body aches.  He denies any runny nose.  Denies any chest pain or abdominal pain.  Says that he has some mild diarrhea.  Possible sick contact at work.  Says that he is off his metformin for a prolonged period of time because he was having difficulty getting erections while taking it.  Past Medical History:  Diagnosis Date  . Diabetes mellitus without complication (HCC)   . Hypertension     There are no active problems to display for this patient.   History reviewed. No pertinent surgical history.  Prior to Admission medications   Medication Sig Start Date End Date Taking? Authorizing Provider  lisinopril (PRINIVIL,ZESTRIL) 5 MG tablet Take 1 tablet (5 mg total) by mouth daily. 09/05/16 02/14/18 Yes Myrna BlazerSchaevitz, Krislynn Gronau Matthew, MD  metFORMIN (GLUCOPHAGE) 500 MG tablet Take 1 tablet (500 mg total) by mouth daily with breakfast. 09/05/16 02/14/18 Yes Elanora Quin, Myra Rudeavid Matthew, MD    Allergies Patient has no known allergies.  History reviewed. No pertinent family history.  Social History Social History   Tobacco Use  . Smoking status: Current Every Day Smoker    Packs/day: 0.50    Types: Cigarettes  . Smokeless tobacco: Never Used  Substance Use Topics  . Alcohol use: Yes    Comment: occasional  . Drug use: No    Review of Systems  Constitutional: No fever/chills Eyes: No visual changes. ENT: No sore throat. Cardiovascular: Denies chest pain. Respiratory: Cough Gastrointestinal: No abdominal pain.  No nausea, no  vomiting.  No constipation. Genitourinary: Negative for dysuria. Musculoskeletal: Negative for back pain. Skin: Negative for rash. Neurological: Negative for headaches, focal weakness or numbness.   ____________________________________________   PHYSICAL EXAM:  VITAL SIGNS: ED Triage Vitals [02/14/18 2237]  Enc Vitals Group     BP (!) 165/97     Pulse Rate (!) 107     Resp 18     Temp (!) 102.2 F (39 C)     Temp Source Oral     SpO2 100 %     Weight 265 lb (120.2 kg)     Height 6\' 4"  (1.93 m)     Head Circumference      Peak Flow      Pain Score 0     Pain Loc      Pain Edu?      Excl. in GC?     Constitutional: Alert and oriented. Well appearing and in no acute distress. Eyes: Conjunctivae are normal.  Head: Atraumatic. Nose: No congestion/rhinnorhea. Mouth/Throat: Mucous membranes are moist.  No pharyngeal erythema.  No tonsillar swelling or exudate. Neck: No stridor.   Cardiovascular: Normal rate, regular rhythm. Grossly normal heart sounds.   Respiratory: Normal respiratory effort.  No retractions. Lungs CTAB. Gastrointestinal: Soft and nontender. No distention.  Musculoskeletal: No lower extremity tenderness nor edema.  No joint effusions. Neurologic:  Normal speech and language. No gross focal neurologic deficits are appreciated. Skin:  Skin is warm, dry and intact. No rash noted. Psychiatric: Mood and affect are normal. Speech and behavior are  normal.  ____________________________________________   LABS (all labs ordered are listed, but only abnormal results are displayed)  Labs Reviewed  GLUCOSE, CAPILLARY - Abnormal; Notable for the following components:      Result Value   Glucose-Capillary 271 (*)    All other components within normal limits  INFLUENZA PANEL BY PCR (TYPE A & B)  CBG MONITORING, ED    ____________________________________________  EKG   ____________________________________________  RADIOLOGY   ____________________________________________   PROCEDURES  Procedure(s) performed:   Procedures  Critical Care performed:   ____________________________________________   INITIAL IMPRESSION / ASSESSMENT AND PLAN / ED COURSE  Pertinent labs & imaging results that were available during my care of the patient were reviewed by me and considered in my medical decision making (see chart for details).  DDX: Pneumonia, influenza, viral illness, upper respiratory infection, bacteremia, strep throat As part of my medical decision making, I reviewed the following data within the electronic MEDICAL RECORD NUMBER Notes from prior ED visits  Patient has defervesced and his temperature is now normal.  He says that the body aches and cough is gone away since the temperatures come down.  Negative for flu.  Glucose of 271.  Lungs clear and patient has not coughed once while I am in the room with him.  Likely viral illness.  We also discussed the importance of managing his diabetes.  He will be referred to the [facility] clinic for further medication recommendations since he does not like the side effects of metformin.  He is understanding of this plan willing to comply. ____________________________________________   FINAL CLINICAL IMPRESSION(S) / ED DIAGNOSES  Viral illness.  Hyperglycemia.    NEW MEDICATIONS STARTED DURING THIS VISIT:  New Prescriptions   No medications on file     Note:  This document was prepared using Dragon voice recognition software and may include unintentional dictation errors.     Myrna Blazer, MD 02/15/18 [phone]

## 2018-03-05 ENCOUNTER — Encounter: Payer: Self-pay | Admitting: Emergency Medicine

## 2018-03-05 ENCOUNTER — Emergency Department
Admission: EM | Admit: 2018-03-05 | Discharge: 2018-03-05 | Disposition: A | Discharge status: Home / Self Care | Payer: 59 | Attending: Internal Medicine | Admitting: Internal Medicine

## 2018-03-05 DIAGNOSIS — Y999 Unspecified external cause status: Secondary | ICD-10-CM | POA: Insufficient documentation

## 2018-03-05 DIAGNOSIS — L03114 Cellulitis of left upper limb: Secondary | ICD-10-CM | POA: Diagnosis not present

## 2018-03-05 DIAGNOSIS — S40862A Insect bite (nonvenomous) of left upper arm, initial encounter: Secondary | ICD-10-CM | POA: Insufficient documentation

## 2018-03-05 DIAGNOSIS — F17210 Nicotine dependence, cigarettes, uncomplicated: Secondary | ICD-10-CM | POA: Insufficient documentation

## 2018-03-05 DIAGNOSIS — Z79899 Other long term (current) drug therapy: Secondary | ICD-10-CM | POA: Diagnosis not present

## 2018-03-05 DIAGNOSIS — W57XXXA Bitten or stung by nonvenomous insect and other nonvenomous arthropods, initial encounter: Secondary | ICD-10-CM | POA: Diagnosis not present

## 2018-03-05 DIAGNOSIS — Y929 Unspecified place or not applicable: Secondary | ICD-10-CM | POA: Diagnosis not present

## 2018-03-05 DIAGNOSIS — Y939 Activity, unspecified: Secondary | ICD-10-CM | POA: Diagnosis not present

## 2018-03-05 DIAGNOSIS — E119 Type 2 diabetes mellitus without complications: Secondary | ICD-10-CM | POA: Insufficient documentation

## 2018-03-05 DIAGNOSIS — R2232 Localized swelling, mass and lump, left upper limb: Secondary | ICD-10-CM | POA: Diagnosis present

## 2018-03-05 DIAGNOSIS — I10 Essential (primary) hypertension: Secondary | ICD-10-CM | POA: Diagnosis not present

## 2018-03-05 MED ORDER — HYDROCORTISONE 2.5 % EX OINT: TOPICAL_OINTMENT | Freq: Two times a day (BID) | CUTANEOUS | 0 refills | Status: AC

## 2018-03-05 MED ORDER — CEPHALEXIN 500 MG PO CAPS: 500.0000 mg | ORAL_CAPSULE | Freq: Three times a day (TID) | ORAL | 0 refills | Status: AC

## 2018-03-05 NOTE — ED Triage Notes (Signed)
Pt comes into the ED via POV c/o wound on his left arm that popped up.  No drainage from the wound present.  Patient unsure if he was bitten by something, but there is now redness surrounding the area.  Patient in NAD at this time with even and unlabored respirations.

## 2018-03-05 NOTE — ED Provider Notes (Signed)
[facility] Emergency Department Provider Note  ____________________________________________  Time seen: Approximately 7:39 PM  I have reviewed the triage vital signs and the nursing notes.   HISTORY  Chief Complaint Wound Check   HPI Nicholas Banks is a 42 y.o. male presents to the emergency department for treatment and evaluation of a lesion on his left arm that just showed up this morning.  He also states that he has a "hard place around."  He states that he had something similar lower on the arm several days ago, but that has healed without any issues.  He states that the area itches. No alleviating measures have been attempted for this complaint.  Past Medical History:  Diagnosis Date  . Diabetes mellitus without complication (HCC)   . Hypertension     There are no active problems to display for this patient.   History reviewed. No pertinent surgical history.  Prior to Admission medications   Medication Sig Start Date End Date Taking? Authorizing Provider  cephALEXin (KEFLEX) 500 MG capsule Take 1 capsule (500 mg total) by mouth 3 (three) times daily for 10 days. 03/05/18 03/15/18  Kimball Manske, Rulon Eisenmengerari B, FNP  hydrocortisone 2.5 % ointment Apply topically 2 (two) times daily. 03/05/18   Monay Houlton B, FNP  lisinopril (PRINIVIL,ZESTRIL) 5 MG tablet Take 1 tablet (5 mg total) by mouth daily. 09/05/16 02/14/18  Myrna BlazerSchaevitz, David Matthew, MD  metFORMIN (GLUCOPHAGE) 500 MG tablet Take 1 tablet (500 mg total) by mouth daily with breakfast. 09/05/16 02/14/18  Schaevitz, Myra Rudeavid Matthew, MD    Allergies Patient has no known allergies.  No family history on file.  Social History Social History   Tobacco Use  . Smoking status: Current Every Day Smoker    Packs/day: 0.50    Types: Cigarettes  . Smokeless tobacco: Never Used  Substance Use Topics  . Alcohol use: Yes    Comment: occasional  . Drug use: No    Review of Systems  Constitutional: Negative  for fever. Respiratory: Negative for cough or shortness of breath.  Musculoskeletal: Negative for myalgias Skin: Positive for lesion to the left lateral elbow area Neurological: Negative for numbness or paresthesias. ____________________________________________   PHYSICAL EXAM:  VITAL SIGNS: ED Triage Vitals  Enc Vitals Group     BP 03/05/18 1833 (!) 167/104     Pulse Rate 03/05/18 1833 99     Resp 03/05/18 1833 18     Temp 03/05/18 1833 98.8 F (37.1 C)     Temp Source 03/05/18 1833 Oral     SpO2 03/05/18 1833 98 %     Weight 03/05/18 1834 265 lb (120.2 kg)     Height 03/05/18 1834 6\' 4"  (1.93 m)     Head Circumference --      Peak Flow --      Pain Score 03/05/18 1834 0     Pain Loc --      Pain Edu? --      Excl. in GC? --      Constitutional: Well appearing. Eyes: Conjunctivae are clear without discharge or drainage. Nose: No rhinorrhea noted. Mouth/Throat: Airway is patent.  Neck: No stridor. Unrestricted range of motion observed. Lymphatic: No palpable adenopathy.  Cardiovascular: Capillary refill is <3 seconds.  Respiratory: Respirations are even and unlabored.. Musculoskeletal: Unrestricted range of motion observed. Neurologic: Awake, alert, and oriented x 4.  Skin:  Annular lesion measuring about 1cm with clear fluid weeping from site. Indurated area to the superior and medial  aspect of the lesion that is mildly erythematous.  ____________________________________________   LABS (all labs ordered are listed, but only abnormal results are displayed)  Labs Reviewed - No data to display ____________________________________________  EKG  Not indicated ____________________________________________  RADIOLOGY  Not indicated. ____________________________________________   PROCEDURES  Procedures ____________________________________________   INITIAL IMPRESSION / ASSESSMENT AND PLAN / ED COURSE  JSON KOELZER is a 41 y.o. male who presents to  the emergency department for evaluation and treatment of of lesion to the left forearm that appears as an insect bite with an early cellulitis. He will be covered with Keflex since he is a diabetic with a history of poor compliance. He will also be given hydrocortisone cream for the itching. He was advised to follow up with his PCP for symptoms that are not improving over the next few days or return to the ER for symptoms that change or worsen if unable to schedule an appointment.  Medications - No data to display   Pertinent labs & imaging results that were available during my care of the patient were reviewed by me and considered in my medical decision making (see chart for details). ____________________________________________   FINAL CLINICAL IMPRESSION(S) / ED DIAGNOSES  Final diagnoses:  Cellulitis of left upper extremity  Insect bite of left upper arm, initial encounter    ED Discharge Orders        Ordered    cephALEXin (KEFLEX) 500 MG capsule  3 times daily     03/05/18 1908    hydrocortisone 2.5 % ointment  2 times daily     03/05/18 1908       Note:  This document was prepared using Dragon voice recognition software and may include unintentional dictation errors.    Chinita Pester, FNP 03/05/18 1947    Emily Filbert, MD 03/12/18 [phone]

## 2018-11-14 IMAGING — DX DG FINGER MIDDLE 2+V*L*
3 series · 3 of 3 positions shown · non-contrast
Comparison: None.

CLINICAL DATA: Splinter in the right middle finger. Erythema and
swelling.

EXAM:
LEFT MIDDLE FINGER 2+V

[finger ap]
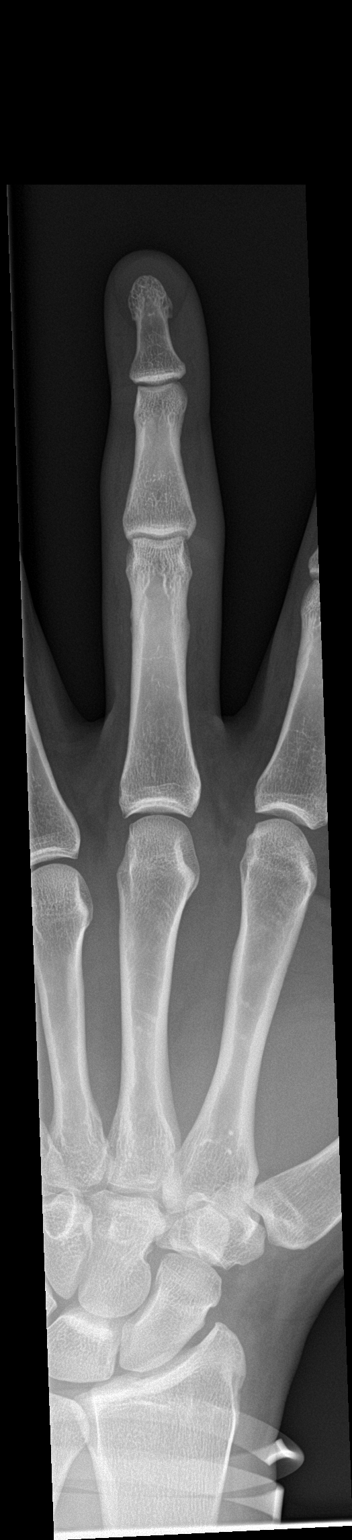

[finger obl]
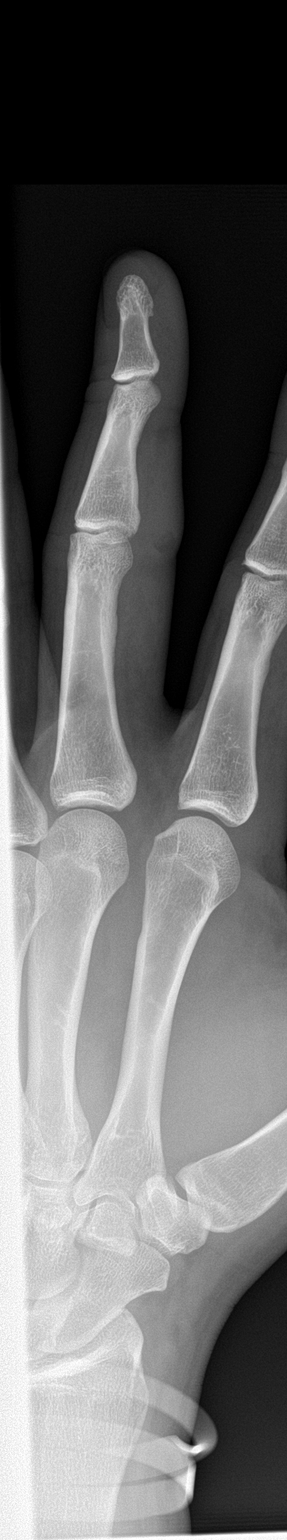

[finger lat]
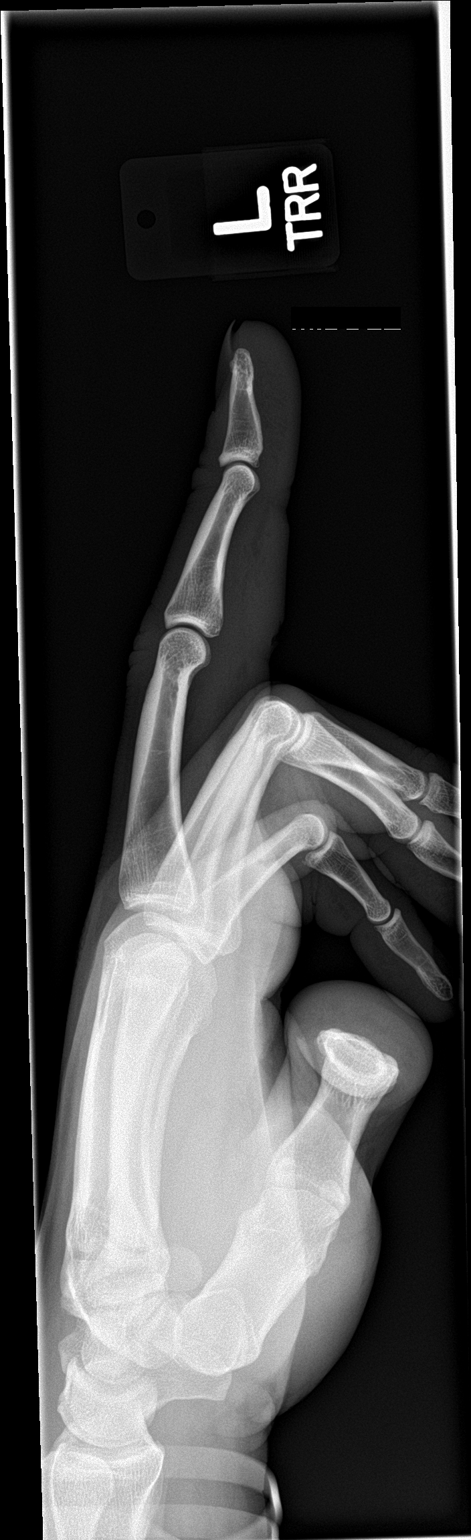

[3 of 3 positions shown; findings below may reference images not displayed]

FINDINGS: There soft tissue swelling along the volar aspect of the left middle
finger with soft tissue defect at the level of the PIP joint also
along the volar aspect. This may represent site of foreign body
penetration. No radiopaque foreign body however is identified. No
bone destruction or fracture seen.
IMPRESSION: Soft tissue swelling of the left middle finger along the volar
aspect with associated skin defect that may represent the site of
penetrating injury. No radiopaque foreign body is noted however.

## 2018-12-05 ENCOUNTER — Encounter: Payer: Self-pay | Admitting: Emergency Medicine

## 2018-12-05 ENCOUNTER — Emergency Department
Admission: EM | Admit: 2018-12-05 | Discharge: 2018-12-05 | Disposition: A | Discharge status: Home / Self Care | Payer: PRIVATE HEALTH INSURANCE | Attending: Emergency Medicine | Admitting: Emergency Medicine

## 2018-12-05 ENCOUNTER — Other Ambulatory Visit: Payer: Self-pay

## 2018-12-05 DIAGNOSIS — I10 Essential (primary) hypertension: Secondary | ICD-10-CM | POA: Diagnosis not present

## 2018-12-05 DIAGNOSIS — R51 Headache: Secondary | ICD-10-CM | POA: Diagnosis present

## 2018-12-05 DIAGNOSIS — Z7984 Long term (current) use of oral hypoglycemic drugs: Secondary | ICD-10-CM | POA: Insufficient documentation

## 2018-12-05 DIAGNOSIS — F17210 Nicotine dependence, cigarettes, uncomplicated: Secondary | ICD-10-CM | POA: Insufficient documentation

## 2018-12-05 DIAGNOSIS — E119 Type 2 diabetes mellitus without complications: Secondary | ICD-10-CM | POA: Diagnosis not present

## 2018-12-05 LAB — CBC
HCT: 39.8 % (ref 39.0–52.0)
Hemoglobin: 13.3 g/dL (ref 13.0–17.0)
MCH: 31.2 pg (ref 26.0–34.0)
MCHC: 33.4 g/dL (ref 30.0–36.0)
MCV: 93.4 fL (ref 80.0–100.0)
Platelets: 287 10*3/uL (ref 150–400)
RBC: 4.26 MIL/uL (ref 4.22–5.81)
RDW: 11.1 % — ABNORMAL LOW (ref 11.5–15.5)
WBC: 5 10*3/uL (ref 4.0–10.5)
nRBC: 0 % (ref 0.0–0.2)

## 2018-12-05 LAB — BASIC METABOLIC PANEL
Anion gap: 5 (ref 5–15)
BUN: 12 mg/dL (ref 6–20)
CALCIUM: 9.1 mg/dL (ref 8.9–10.3)
CHLORIDE: 103 mmol/L (ref 98–111)
CO2: 29 mmol/L (ref 22–32)
Creatinine, Ser: 0.69 mg/dL (ref 0.61–1.24)
Glucose, Bld: 123 mg/dL — ABNORMAL HIGH (ref 70–99)
Potassium: 4 mmol/L (ref 3.5–5.1)
SODIUM: 137 mmol/L (ref 135–145)

## 2018-12-05 LAB — GLUCOSE, CAPILLARY: Glucose-Capillary: 110 mg/dL — ABNORMAL HIGH (ref 70–99)

## 2018-12-05 LAB — TROPONIN I

## 2018-12-05 MED ORDER — LISINOPRIL 20 MG PO TABS: 20.0000 mg | ORAL_TABLET | Freq: Every day | ORAL | 1 refills | Status: AC

## 2018-12-05 NOTE — ED Triage Notes (Signed)
Pt ambulatory to triage with steady gait, reports he has been having a mild headache for past couple of days "I think my BP is high" reports history of HTN and ran  Out of his BP medication lisinopril 5mg  once a day.

## 2018-12-05 NOTE — ED Provider Notes (Signed)
[facility] Emergency Department Provider Note   ____________________________________________    I have reviewed the triage vital signs and the nursing notes.   HISTORY  Chief Complaint Headache and Hypertension     HPI Nicholas Banks is a 43 y.o. male who presents with complaints of high blood pressure.  Patient reports over the last several days his blood pressure has been higher than usual.  He admits this is likely because he has not been taking his blood pressure medication, lisinopril, since he ran out sometime ago.  Prior to that he took it intermittently.  Overall he feels well, he has no chest pain or shortness of breath or extremity swelling.  Does admit to mild intermittent headaches.  No neuro deficits.  Past Medical History:  Diagnosis Date  . Diabetes mellitus without complication (HCC)   . Hypertension     There are no active problems to display for this patient.   History reviewed. No pertinent surgical history.  Prior to Admission medications   Medication Sig Start Date End Date Taking? Authorizing Provider  hydrocortisone 2.5 % ointment Apply topically 2 (two) times daily. 03/05/18   Triplett, Cari B, FNP  lisinopril (PRINIVIL,ZESTRIL) 20 MG tablet Take 1 tablet (20 mg total) by mouth daily. 12/05/18 02/03/19  Jene Every, MD  metFORMIN (GLUCOPHAGE) 500 MG tablet Take 1 tablet (500 mg total) by mouth daily with breakfast. 09/05/16 02/14/18  Schaevitz, Myra Rude, MD     Allergies Patient has no known allergies.  No family history on file.  Social History Social History   Tobacco Use  . Smoking status: Current Every Day Smoker    Packs/day: 0.50    Types: Cigarettes  . Smokeless tobacco: Never Used  Substance Use Topics  . Alcohol use: Yes    Comment: occasional  . Drug use: No    Review of Systems  Constitutional: No dizziness Eyes: No blurry vision ENT: No neck pain Cardiovascular: Denies chest  pain. Respiratory: Denies shortness of breath.     Skin: Negative for rash. Neurological: No weakness   ____________________________________________   PHYSICAL EXAM:  VITAL SIGNS: ED Triage Vitals  Enc Vitals Group     BP 12/05/18 1611 (!) 170/122     Pulse Rate 12/05/18 1611 95     Resp 12/05/18 1611 20     Temp 12/05/18 1611 98.1 F (36.7 C)     Temp Source 12/05/18 1611 Oral     SpO2 12/05/18 1611 100 %     Weight 12/05/18 1612 113.4 kg (250 lb)     Height 12/05/18 1612 1.88 m (6\' 2" )     Head Circumference --      Peak Flow --      Pain Score 12/05/18 1612 0     Pain Loc --      Pain Edu? --      Excl. in GC? --     Constitutional: Alert and oriented. No acute distress. e  Mouth/Throat: Mucous membranes are moist.    Cardiovascular: Normal rate, regular rhythm. Grossly normal heart sounds.  Good peripheral circulation. Respiratory: Normal respiratory effort.  No retractions. Lungs CTAB.   Musculoskeletal: No lower extremity tenderness nor edema.  Warm and well perfused Neurologic:  Normal speech and language. No gross focal neurologic deficits are appreciated.  Skin:  Skin is warm, dry and intact. No rash noted. Psychiatric: Mood and affect are normal. Speech and behavior are normal.  ____________________________________________   LABS (all  labs ordered are listed, but only abnormal results are displayed)  Labs Reviewed  BASIC METABOLIC PANEL - Abnormal; Notable for the following components:      Result Value   Glucose, Bld 123 (*)    All other components within normal limits  CBC - Abnormal; Notable for the following components:   RDW 11.1 (*)    All other components within normal limits  GLUCOSE, CAPILLARY - Abnormal; Notable for the following components:   Glucose-Capillary 110 (*)    All other components within normal limits  TROPONIN I  TROPONIN I  CBG MONITORING, ED   ____________________________________________  EKG  ED ECG REPORT I,  Jene Everyobert Konstantine Gervasi, the attending physician, personally viewed and interpreted this ECG.  Date: 12/05/2018  Rhythm: normal sinus rhythm QRS Axis: normal Intervals: normal ST/T Wave abnormalities: Nonspecific changes Narrative Interpretation: no evidence of acute ischemia  ____________________________________________  RADIOLOGY  None ____________________________________________   PROCEDURES  Procedure(s) performed: No  Procedures   Critical Care performed: No ____________________________________________   INITIAL IMPRESSION / ASSESSMENT AND PLAN / ED COURSE  Pertinent labs & imaging results that were available during my care of the patient were reviewed by me and considered in my medical decision making (see chart for details).  Patient well-appearing and in no acute distress, essentially asymptomatic here in the emergency department.  Blood pressure is elevated but trending down during his ED stay.  Blood pressure likely high because he has been off of his antihypertensives, he reports that he used to be on 10 mg of lisinopril that was decreased to 5 mg.  I suspect he requires more.  We will prescribe 20 mg of lisinopril, emphasized the need for outpatient follow-up for adjustment of medications or possible addition of medications.  Emphasized compliance of antihypertensives to avoid stroke, heart attack, heart failure etc.    ____________________________________________   FINAL CLINICAL IMPRESSION(S) / ED DIAGNOSES  Final diagnoses:  Essential hypertension        Note:  This document was prepared using Dragon voice recognition software and may include unintentional dictation errors.   Jene EveryKinner, Quinzell Malcomb, MD 12/05/18 [phone]

## 2024-05-22 DIAGNOSIS — I10 Essential (primary) hypertension: Secondary | ICD-10-CM | POA: Diagnosis not present

## 2024-05-22 DIAGNOSIS — M79605 Pain in left leg: Secondary | ICD-10-CM | POA: Diagnosis not present

## 2024-05-22 DIAGNOSIS — M5432 Sciatica, left side: Secondary | ICD-10-CM | POA: Diagnosis not present

## 2024-05-22 DIAGNOSIS — F17210 Nicotine dependence, cigarettes, uncomplicated: Secondary | ICD-10-CM | POA: Diagnosis not present

## 2024-05-22 DIAGNOSIS — E119 Type 2 diabetes mellitus without complications: Secondary | ICD-10-CM | POA: Diagnosis not present

## 2024-05-22 DIAGNOSIS — I509 Heart failure, unspecified: Secondary | ICD-10-CM | POA: Diagnosis not present

## 2024-05-22 DIAGNOSIS — Z79899 Other long term (current) drug therapy: Secondary | ICD-10-CM | POA: Diagnosis not present

## 2024-05-25 DIAGNOSIS — M51362 Other intervertebral disc degeneration, lumbar region with discogenic back pain and lower extremity pain: Secondary | ICD-10-CM | POA: Diagnosis not present
